# Patient Record
Sex: Female | Born: 1955
Health system: Southern US, Community
[De-identification: ages and names within clinical notes are randomized; demographics above are authoritative.]

## PROBLEM LIST (undated history)

## (undated) DIAGNOSIS — C50919 Malignant neoplasm of unspecified site of unspecified female breast: Secondary | ICD-10-CM

## (undated) HISTORY — PX: ABDOMINAL HYSTERECTOMY: SHX81

## (undated) HISTORY — PX: MASTECTOMY: SHX3

---

## 2019-04-23 DIAGNOSIS — L578 Other skin changes due to chronic exposure to nonionizing radiation: Secondary | ICD-10-CM | POA: Diagnosis not present

## 2019-04-23 DIAGNOSIS — D225 Melanocytic nevi of trunk: Secondary | ICD-10-CM | POA: Diagnosis not present

## 2019-04-23 DIAGNOSIS — L570 Actinic keratosis: Secondary | ICD-10-CM | POA: Diagnosis not present

## 2019-04-23 DIAGNOSIS — L814 Other melanin hyperpigmentation: Secondary | ICD-10-CM | POA: Diagnosis not present

## 2019-06-25 DIAGNOSIS — R229 Localized swelling, mass and lump, unspecified: Secondary | ICD-10-CM | POA: Diagnosis not present

## 2019-06-25 DIAGNOSIS — Z1329 Encounter for screening for other suspected endocrine disorder: Secondary | ICD-10-CM | POA: Diagnosis not present

## 2019-06-25 DIAGNOSIS — K432 Incisional hernia without obstruction or gangrene: Secondary | ICD-10-CM | POA: Diagnosis not present

## 2019-06-25 DIAGNOSIS — Z0000 Encounter for general adult medical examination without abnormal findings: Secondary | ICD-10-CM | POA: Diagnosis not present

## 2019-06-25 DIAGNOSIS — Z853 Personal history of malignant neoplasm of breast: Secondary | ICD-10-CM | POA: Diagnosis not present

## 2019-06-25 DIAGNOSIS — Z13220 Encounter for screening for lipoid disorders: Secondary | ICD-10-CM | POA: Diagnosis not present

## 2019-07-28 DIAGNOSIS — K432 Incisional hernia without obstruction or gangrene: Secondary | ICD-10-CM | POA: Diagnosis not present

## 2019-07-28 DIAGNOSIS — Z1211 Encounter for screening for malignant neoplasm of colon: Secondary | ICD-10-CM | POA: Diagnosis not present

## 2019-07-28 DIAGNOSIS — R221 Localized swelling, mass and lump, neck: Secondary | ICD-10-CM | POA: Diagnosis not present

## 2019-08-04 DIAGNOSIS — E041 Nontoxic single thyroid nodule: Secondary | ICD-10-CM | POA: Diagnosis not present

## 2019-08-04 DIAGNOSIS — R221 Localized swelling, mass and lump, neck: Secondary | ICD-10-CM | POA: Diagnosis not present

## 2019-08-04 DIAGNOSIS — R229 Localized swelling, mass and lump, unspecified: Secondary | ICD-10-CM | POA: Diagnosis not present

## 2019-08-06 DIAGNOSIS — Z1211 Encounter for screening for malignant neoplasm of colon: Secondary | ICD-10-CM | POA: Diagnosis not present

## 2019-10-12 DIAGNOSIS — J342 Deviated nasal septum: Secondary | ICD-10-CM | POA: Diagnosis not present

## 2019-10-12 DIAGNOSIS — Z853 Personal history of malignant neoplasm of breast: Secondary | ICD-10-CM | POA: Diagnosis not present

## 2019-10-12 DIAGNOSIS — R221 Localized swelling, mass and lump, neck: Secondary | ICD-10-CM | POA: Diagnosis not present

## 2019-10-12 DIAGNOSIS — K118 Other diseases of salivary glands: Secondary | ICD-10-CM | POA: Diagnosis not present

## 2019-10-19 DIAGNOSIS — L821 Other seborrheic keratosis: Secondary | ICD-10-CM | POA: Diagnosis not present

## 2019-12-28 DIAGNOSIS — Z9011 Acquired absence of right breast and nipple: Secondary | ICD-10-CM | POA: Diagnosis not present

## 2019-12-28 DIAGNOSIS — C50912 Malignant neoplasm of unspecified site of left female breast: Secondary | ICD-10-CM | POA: Diagnosis not present

## 2020-01-11 DIAGNOSIS — Z9011 Acquired absence of right breast and nipple: Secondary | ICD-10-CM | POA: Diagnosis not present

## 2020-01-11 DIAGNOSIS — C50912 Malignant neoplasm of unspecified site of left female breast: Secondary | ICD-10-CM | POA: Diagnosis not present

## 2020-02-03 DIAGNOSIS — M9901 Segmental and somatic dysfunction of cervical region: Secondary | ICD-10-CM | POA: Diagnosis not present

## 2020-02-03 DIAGNOSIS — M9903 Segmental and somatic dysfunction of lumbar region: Secondary | ICD-10-CM | POA: Diagnosis not present

## 2020-02-03 DIAGNOSIS — M5032 Other cervical disc degeneration, mid-cervical region, unspecified level: Secondary | ICD-10-CM | POA: Diagnosis not present

## 2020-02-03 DIAGNOSIS — M47812 Spondylosis without myelopathy or radiculopathy, cervical region: Secondary | ICD-10-CM | POA: Diagnosis not present

## 2020-02-04 DIAGNOSIS — M5032 Other cervical disc degeneration, mid-cervical region, unspecified level: Secondary | ICD-10-CM | POA: Diagnosis not present

## 2020-02-04 DIAGNOSIS — M9903 Segmental and somatic dysfunction of lumbar region: Secondary | ICD-10-CM | POA: Diagnosis not present

## 2020-02-04 DIAGNOSIS — M9901 Segmental and somatic dysfunction of cervical region: Secondary | ICD-10-CM | POA: Diagnosis not present

## 2020-02-04 DIAGNOSIS — M47812 Spondylosis without myelopathy or radiculopathy, cervical region: Secondary | ICD-10-CM | POA: Diagnosis not present

## 2020-02-08 DIAGNOSIS — M5032 Other cervical disc degeneration, mid-cervical region, unspecified level: Secondary | ICD-10-CM | POA: Diagnosis not present

## 2020-02-08 DIAGNOSIS — M9903 Segmental and somatic dysfunction of lumbar region: Secondary | ICD-10-CM | POA: Diagnosis not present

## 2020-02-08 DIAGNOSIS — M9901 Segmental and somatic dysfunction of cervical region: Secondary | ICD-10-CM | POA: Diagnosis not present

## 2020-02-08 DIAGNOSIS — M47812 Spondylosis without myelopathy or radiculopathy, cervical region: Secondary | ICD-10-CM | POA: Diagnosis not present

## 2020-02-10 DIAGNOSIS — M9903 Segmental and somatic dysfunction of lumbar region: Secondary | ICD-10-CM | POA: Diagnosis not present

## 2020-02-10 DIAGNOSIS — M9901 Segmental and somatic dysfunction of cervical region: Secondary | ICD-10-CM | POA: Diagnosis not present

## 2020-02-10 DIAGNOSIS — M47812 Spondylosis without myelopathy or radiculopathy, cervical region: Secondary | ICD-10-CM | POA: Diagnosis not present

## 2020-02-10 DIAGNOSIS — M5032 Other cervical disc degeneration, mid-cervical region, unspecified level: Secondary | ICD-10-CM | POA: Diagnosis not present

## 2020-02-11 DIAGNOSIS — M47812 Spondylosis without myelopathy or radiculopathy, cervical region: Secondary | ICD-10-CM | POA: Diagnosis not present

## 2020-02-11 DIAGNOSIS — M5032 Other cervical disc degeneration, mid-cervical region, unspecified level: Secondary | ICD-10-CM | POA: Diagnosis not present

## 2020-02-11 DIAGNOSIS — M9903 Segmental and somatic dysfunction of lumbar region: Secondary | ICD-10-CM | POA: Diagnosis not present

## 2020-02-11 DIAGNOSIS — M9901 Segmental and somatic dysfunction of cervical region: Secondary | ICD-10-CM | POA: Diagnosis not present

## 2020-05-11 DIAGNOSIS — D225 Melanocytic nevi of trunk: Secondary | ICD-10-CM | POA: Diagnosis not present

## 2020-05-11 DIAGNOSIS — L578 Other skin changes due to chronic exposure to nonionizing radiation: Secondary | ICD-10-CM | POA: Diagnosis not present

## 2020-05-11 DIAGNOSIS — L814 Other melanin hyperpigmentation: Secondary | ICD-10-CM | POA: Diagnosis not present

## 2020-05-11 DIAGNOSIS — L570 Actinic keratosis: Secondary | ICD-10-CM | POA: Diagnosis not present

## 2020-05-31 DIAGNOSIS — R310 Gross hematuria: Secondary | ICD-10-CM | POA: Diagnosis not present

## 2020-06-02 DIAGNOSIS — Z0189 Encounter for other specified special examinations: Secondary | ICD-10-CM | POA: Diagnosis not present

## 2020-06-15 DIAGNOSIS — M545 Low back pain: Secondary | ICD-10-CM | POA: Diagnosis not present

## 2020-06-22 DIAGNOSIS — N1330 Unspecified hydronephrosis: Secondary | ICD-10-CM | POA: Diagnosis not present

## 2020-06-22 DIAGNOSIS — K824 Cholesterolosis of gallbladder: Secondary | ICD-10-CM | POA: Diagnosis not present

## 2020-06-22 DIAGNOSIS — R310 Gross hematuria: Secondary | ICD-10-CM | POA: Diagnosis not present

## 2020-07-28 ENCOUNTER — Other Ambulatory Visit: Payer: Self-pay

## 2020-07-28 ENCOUNTER — Emergency Department (HOSPITAL_BASED_OUTPATIENT_CLINIC_OR_DEPARTMENT_OTHER): Payer: Federal, State, Local not specified - PPO

## 2020-07-28 ENCOUNTER — Emergency Department (HOSPITAL_BASED_OUTPATIENT_CLINIC_OR_DEPARTMENT_OTHER)
Admission: EM | Admit: 2020-07-28 | Discharge: 2020-07-28 | Disposition: A | Discharge status: Home / Self Care | Payer: Federal, State, Local not specified - PPO | Attending: Emergency Medicine | Admitting: Emergency Medicine

## 2020-07-28 ENCOUNTER — Encounter (HOSPITAL_BASED_OUTPATIENT_CLINIC_OR_DEPARTMENT_OTHER): Payer: Self-pay

## 2020-07-28 DIAGNOSIS — N132 Hydronephrosis with renal and ureteral calculous obstruction: Secondary | ICD-10-CM | POA: Diagnosis not present

## 2020-07-28 DIAGNOSIS — R1110 Vomiting, unspecified: Secondary | ICD-10-CM | POA: Diagnosis not present

## 2020-07-28 DIAGNOSIS — R112 Nausea with vomiting, unspecified: Secondary | ICD-10-CM | POA: Diagnosis not present

## 2020-07-28 DIAGNOSIS — N200 Calculus of kidney: Secondary | ICD-10-CM | POA: Diagnosis not present

## 2020-07-28 DIAGNOSIS — Z853 Personal history of malignant neoplasm of breast: Secondary | ICD-10-CM | POA: Insufficient documentation

## 2020-07-28 DIAGNOSIS — R1013 Epigastric pain: Secondary | ICD-10-CM | POA: Diagnosis not present

## 2020-07-28 DIAGNOSIS — R109 Unspecified abdominal pain: Secondary | ICD-10-CM | POA: Diagnosis not present

## 2020-07-28 DIAGNOSIS — R110 Nausea: Secondary | ICD-10-CM | POA: Diagnosis not present

## 2020-07-28 HISTORY — DX: Malignant neoplasm of unspecified site of unspecified female breast: C50.919

## 2020-07-28 LAB — CBC WITH DIFFERENTIAL/PLATELET
Abs Immature Granulocytes: 0.01 10*3/uL (ref 0.00–0.07)
Basophils Absolute: 0 10*3/uL (ref 0.0–0.1)
Basophils Relative: 0 %
Eosinophils Absolute: 0 10*3/uL (ref 0.0–0.5)
Eosinophils Relative: 0 %
HCT: 40.8 % (ref 36.0–46.0)
Hemoglobin: 13.7 g/dL (ref 12.0–15.0)
Immature Granulocytes: 0 %
Lymphocytes Relative: 14 %
Lymphs Abs: 0.9 10*3/uL (ref 0.7–4.0)
MCH: 32.9 pg (ref 26.0–34.0)
MCHC: 33.6 g/dL (ref 30.0–36.0)
MCV: 97.8 fL (ref 80.0–100.0)
Monocytes Absolute: 0.6 10*3/uL (ref 0.1–1.0)
Monocytes Relative: 9 %
Neutro Abs: 4.9 10*3/uL (ref 1.7–7.7)
Neutrophils Relative %: 77 %
Platelets: 189 10*3/uL (ref 150–400)
RBC: 4.17 MIL/uL (ref 3.87–5.11)
RDW: 12.1 % (ref 11.5–15.5)
WBC: 6.3 10*3/uL (ref 4.0–10.5)
nRBC: 0 % (ref 0.0–0.2)

## 2020-07-28 LAB — COMPREHENSIVE METABOLIC PANEL
ALT: 15 U/L (ref 0–44)
AST: 25 U/L (ref 15–41)
Albumin: 4.2 g/dL (ref 3.5–5.0)
Alkaline Phosphatase: 55 U/L (ref 38–126)
Anion gap: 8 (ref 5–15)
BUN: 19 mg/dL (ref 8–23)
CO2: 26 mmol/L (ref 22–32)
Calcium: 9.2 mg/dL (ref 8.9–10.3)
Chloride: 104 mmol/L (ref 98–111)
Creatinine, Ser: 1.06 mg/dL — ABNORMAL HIGH (ref 0.44–1.00)
GFR, Estimated: 59 mL/min — ABNORMAL LOW (ref >60)
Glucose, Bld: 122 mg/dL — ABNORMAL HIGH (ref 70–99)
Potassium: 3.8 mmol/L (ref 3.5–5.1)
Sodium: 138 mmol/L (ref 135–145)
Total Bilirubin: 0.6 mg/dL (ref 0.3–1.2)
Total Protein: 7.2 g/dL (ref 6.5–8.1)

## 2020-07-28 LAB — I-STAT CHEM 8, ED
BUN: 18 mg/dL (ref 8–23)
Calcium, Ion: 1.24 mmol/L (ref 1.15–1.40)
Chloride: 102 mmol/L (ref 98–111)
Creatinine, Ser: 0.9 mg/dL (ref 0.44–1.00)
Glucose, Bld: 119 mg/dL — ABNORMAL HIGH (ref 70–99)
HCT: 40 % (ref 36.0–46.0)
Hemoglobin: 13.6 g/dL (ref 12.0–15.0)
Potassium: 3.8 mmol/L (ref 3.5–5.1)
Sodium: 141 mmol/L (ref 135–145)
TCO2: 24 mmol/L (ref 22–32)

## 2020-07-28 LAB — LIPASE, BLOOD: Lipase: 26 U/L (ref 11–51)

## 2020-07-28 MED ORDER — TAMSULOSIN HCL 0.4 MG PO CAPS: 0.4000 mg | ORAL_CAPSULE | Freq: Every day | ORAL | 0 refills | Status: AC

## 2020-07-28 MED ORDER — ONDANSETRON 4 MG PO TBDP: 4.0000 mg | ORAL_TABLET | Freq: Three times a day (TID) | ORAL | 0 refills | Status: AC | PRN

## 2020-07-28 MED ORDER — OXYCODONE-ACETAMINOPHEN 5-325 MG PO TABS
1.0000 | ORAL_TABLET | Freq: Four times a day (QID) | ORAL | 0 refills | Status: AC | PRN
Start: 2020-07-28 — End: ?

## 2020-07-28 MED ORDER — LACTATED RINGERS IV BOLUS
1000.0000 mL | Freq: Once | INTRAVENOUS | Status: AC
  Administered 2020-07-28: 1000 mL via INTRAVENOUS

## 2020-07-28 MED ORDER — IOHEXOL 300 MG/ML  SOLN
100.0000 mL | Freq: Once | INTRAMUSCULAR | Status: AC | PRN
  Administered 2020-07-28: 100 mL via INTRAVENOUS

## 2020-07-28 NOTE — Discharge Instructions (Signed)
If you develop fever, worsening pain, persistent vomiting return to the ER

## 2020-07-28 NOTE — ED Provider Notes (Signed)
[facility] Provider Note   CSN: [id number] Arrival date & time: 07/28/20  1637     History Chief Complaint  Patient presents with  . Abdominal Pain    Sheryl Rodriguez is a 64 y.o. female.  Patient is a 64 year old female with a history of breast cancer who is presenting today with complaint of abdominal pain.  Patient was at acute care prior to this and was sent here for further evaluation.  Patient reports she felt completely normal yesterday and normal when she went to bed.  Around 4 AM she woke up with a severe 10 out of 10 pain which she describes was in her lower abdomen.  She since that time has had 4-5 episodes of vomiting and 4-5 episodes of soft stool but no diarrhea.  She reports the pain is in the suprapubic area and radiates around into her back on both sides.  Nothing seems to make the pain worse.  No movement or eating.  She reports she really has not been able to hold anything down and is only been able to take 4 sips of Gatorade today.  She has not had fever, cough, congestion, shortness of breath.  She has had a complete hysterectomy but no other abdominal surgeries.  Patient reports that she did have some blood in her urine a month ago and saw her PCP.  At that time she had an ultrasound that showed some polyps in the gallbladder and possibly a urinary tract infection but reports then the culture came back negative and she has not had any problems since.  Patient does have a prior history of kidney stone 15 years ago that did require intervention but reports she has never experienced pain like this.  She reports at this time the pain is still 8 out of 10.  It is sharp and very uncomfortable.  She has not had any dysuria, frequency or urgency.  No bad food contacts.  She did have a near syncopal episode today when she stood up too quickly when she went to the bathroom to vomit but denies any injury.  At urgent care patient did have a urine that showed  moderate hemoglobin, ketones and protein but no other signs of infection.  The history is provided by the patient.  Abdominal Pain      Past Medical History:  Diagnosis Date  . Breast cancer ([place])     There are no problems to display for this patient.   Past Surgical History:  Procedure Laterality Date  . ABDOMINAL HYSTERECTOMY    . MASTECTOMY       OB History   No obstetric history on file.     No family history on file.  Social History   Tobacco Use  . Smoking status: Never Smoker  . Smokeless tobacco: Never Used  Substance Use Topics  . Alcohol use: Never  . Drug use: Never    Home Medications Prior to Admission medications   Not on File    Allergies    Codeine  Review of Systems   Review of Systems  Gastrointestinal: Positive for abdominal pain.  All other systems reviewed and are negative.   Physical Exam Updated Vital Signs BP 137/80 (BP Location: Left Arm)   Pulse 65   Temp 98.6 F (37 C) (Oral)   Resp 18   Ht 5\' 5"  (1.651 m)   Wt 61.2 kg   SpO2 98%   BMI 22.47 kg/m   Physical Exam  Vitals and nursing note reviewed.  Constitutional:      General: She is not in acute distress.    Appearance: She is well-developed and normal weight.  HENT:     Head: Normocephalic and atraumatic.     Mouth/Throat:     Mouth: Mucous membranes are dry.  Eyes:     Pupils: Pupils are equal, round, and reactive to light.  Cardiovascular:     Rate and Rhythm: Normal rate and regular rhythm.     Pulses: Normal pulses.     Heart sounds: Normal heart sounds. No murmur heard.  No friction rub.  Pulmonary:     Effort: Pulmonary effort is normal.     Breath sounds: Normal breath sounds. No wheezing or rales.  Abdominal:     General: Abdomen is flat. Bowel sounds are normal. There is no distension.     Palpations: Abdomen is soft.     Tenderness: There is abdominal tenderness in the epigastric area, suprapubic area and left lower quadrant. There is right  CVA tenderness. There is no guarding or rebound.     Hernia: No hernia is present.  Musculoskeletal:        General: No tenderness. Normal range of motion.     Right lower leg: No edema.     Left lower leg: No edema.     Comments: No edema  Skin:    General: Skin is warm and dry.     Findings: No rash.  Neurological:     General: No focal deficit present.     Mental Status: She is alert and oriented to person, place, and time. Mental status is at baseline.     Cranial Nerves: No cranial nerve deficit.  Psychiatric:        Mood and Affect: Mood normal.        Behavior: Behavior normal.        Thought Content: Thought content normal.     ED Results / Procedures / Treatments   Labs (all labs ordered are listed, but only abnormal results are displayed) Labs Reviewed  COMPREHENSIVE METABOLIC PANEL - Abnormal; Notable for the following components:      Result Value   Glucose, Bld 122 (*)    Creatinine, Ser 1.06 (*)    GFR, Estimated 59 (*)    All other components within normal limits  I-STAT CHEM 8, ED - Abnormal; Notable for the following components:   Glucose, Bld 119 (*)    All other components within normal limits  CBC WITH DIFFERENTIAL/PLATELET  LIPASE, BLOOD    EKG None  Radiology CT ABDOMEN PELVIS W CONTRAST  Result Date: 07/28/2020 CLINICAL DATA:  Abdominal pain with nausea and vomiting EXAM: CT ABDOMEN AND PELVIS WITH CONTRAST TECHNIQUE: Multidetector CT imaging of the abdomen and pelvis was performed using the standard protocol following bolus administration of intravenous contrast. CONTRAST:  159mL OMNIPAQUE IOHEXOL 300 MG/ML  SOLN COMPARISON:  Ultrasound 06/22/2020 FINDINGS: Lower chest: Lung bases demonstrate no acute consolidation or pleural effusion. Cardiac size within normal limits. Small hiatal hernia. Hepatobiliary: No focal liver abnormality is seen. No gallstones, gallbladder wall thickening, or biliary dilatation. Pancreas: Unremarkable. No pancreatic  ductal dilatation or surrounding inflammatory changes. Spleen: Normal in size without focal abnormality. Adrenals/Urinary Tract: Adrenal glands are normal. Moderate severe right hydronephrosis and proximal hydroureter, secondary to 2 adjacent stones within the right ureter just distal to the right UPJ. The more cranial stone measures 7 mm in size and the more caudal stone  measures 5 mm in size. There is mild urothelial enhancement of the right renal pelvis and proximal ureter. The urinary bladder is unremarkable. Delayed right nephrogram consistent with obstruction. Stomach/Bowel: Stomach is within normal limits. Negative appendix. No evidence of bowel wall thickening, distention, or inflammatory changes. Vascular/Lymphatic: Nonaneurysmal aorta.  No suspicious adenopathy Reproductive: Status post hysterectomy. No adnexal masses. Other: No free air or free fluid. Hazy infiltration of the left abdominal mesentery with small nodes. Musculoskeletal: No acute or significant osseous findings. Degenerative changes at L3-L4. IMPRESSION: 1. Moderate severe right hydronephrosis and proximal hydroureter, secondary to 2 adjacent stones within the right ureter just distal to the right UPJ. The more cranial stone measures 7 mm in size and the more caudal stone measures 5 mm in size. There is mild urothelial enhancement of the right renal pelvis and proximal ureter which may be secondary to infection or inflammation. 2. Hazy infiltration of the left abdominal mesentery with small nodes, suggestive of mesenteric panniculitis. 3. Small hiatal hernia. Electronically Signed   By: Donavan Foil M.D.   On: 07/28/2020 18:09    Procedures Procedures (including critical care time)  Medications Ordered in ED Medications  lactated ringers bolus 1,000 mL (has no administration in time range)    ED Course  I have reviewed the triage vital signs and the nursing notes.  Pertinent labs & imaging results that were available during my  care of the patient were reviewed by me and considered in my medical decision making (see chart for details).    MDM Rules/Calculators/A&P                          Pleasant 64 year old female presenting today with abdominal pain that was sudden onset at 4 AM this morning.  She has had ongoing vomiting and soft stools.  On exam she has epigastric discomfort as well as suprapubic and left lower quadrant pain.  Patient did have blood in her urine at urgent care but no evidence of infection.  She is not having fever or other infectious symptoms.  Low suspicion for cardiac or respiratory issue.  Given her upper and lower abdominal pain concern for possible pancreatitis, cholecystitis, nephrolithiasis.  Lower suspicion for bowel obstruction as patient is having stools.  Labs and CT pending.  Patient received antiemetics at urgent care and at this time reports she does not want pain medication.  She was given a bolus of fluid.  6:37 PM On reevaluation patient reports the nausea is gone and her pain is still an 8 out of 10 but she does not wish to have pain medication.  CBC and CMP without acute findings.  CT of the abdomen and pelvis shows moderate severe right hydronephrosis and proximal hydroureter secondary to 2 adjacent stones within the right ureter just distal to the right UPJ.  The stones are 7 mm 5 mm in size.  Patient's urine from urgent care showed no signs of infection white count normal and patient has been afebrile.  Also on CT it shows a hazy infiltration of the left abdominal mesentery with small nodes suggestive of mesenteric panniculitis.  Did discuss these findings with the patient and she needs to follow-up with her PCP.  Also will have patient follow-up with urology if stones do not pass.  She was started on Flomax but does not wish to have pain medication at this time.  MDM Number of Diagnoses or Management Options   Amount and/or Complexity of  Data Reviewed Clinical lab tests: reviewed  and ordered Tests in the radiology section of CPT: ordered and reviewed Decide to obtain previous medical records or to obtain history from someone other than the patient: yes Obtain history from someone other than the patient: no Review and summarize past medical records: yes Independent visualization of images, tracings, or specimens: yes  Risk of Complications, Morbidity, and/or Mortality Presenting problems: moderate Diagnostic procedures: low Management options: low  Patient Progress Patient progress: stable     Final Clinical Impression(s) / ED Diagnoses Final diagnoses:  Nephrolithiasis    Rx / DC Orders ED Discharge Orders         Ordered    tamsulosin (FLOMAX) 0.4 MG CAPS capsule  Daily after supper        07/28/20 1841    ondansetron (ZOFRAN ODT) 4 MG disintegrating tablet  Every 8 hours PRN        07/28/20 1841           Blanchie Dessert, MD 07/28/20 1841

## 2020-07-28 NOTE — ED Triage Notes (Signed)
Pt c/o lower abd pain, n/v started yesterday-sent from [facility]-steady gait

## 2020-08-01 DIAGNOSIS — N201 Calculus of ureter: Secondary | ICD-10-CM | POA: Diagnosis not present

## 2020-08-03 DIAGNOSIS — N201 Calculus of ureter: Secondary | ICD-10-CM | POA: Diagnosis not present

## 2021-08-24 IMAGING — CT CT ABD-PELV W/ CM
2 of 5 series · 15 of 46 positions shown, 17 images · IV contrast (Omnipaque)
Comparison: Ultrasound 06/22/2020

CLINICAL DATA: Abdominal pain with nausea and vomiting

EXAM:
CT ABDOMEN AND PELVIS WITH CONTRAST
TECHNIQUE: Multidetector CT imaging of the abdomen and pelvis was performed
using the standard protocol following bolus administration of
intravenous contrast.
CONTRAST:  100mL OMNIPAQUE IOHEXOL 300 MG/ML  SOLN

[Series 2: axial st · axial · 0.74mm/px · z∈[-603,-233]mm · 12 of 84 slices shown, 14 images]
[im 5/84  soft-tissue]
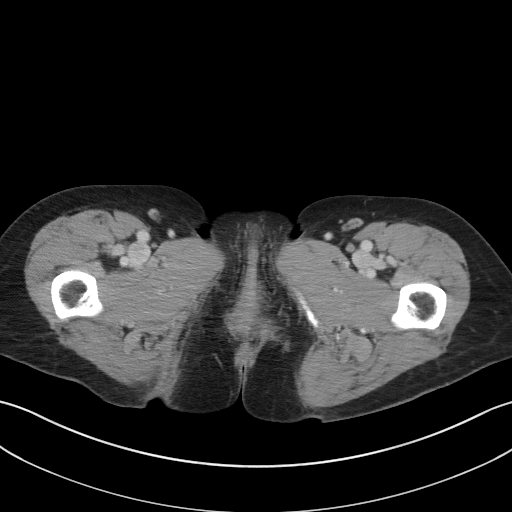
[im 5/84  bone]
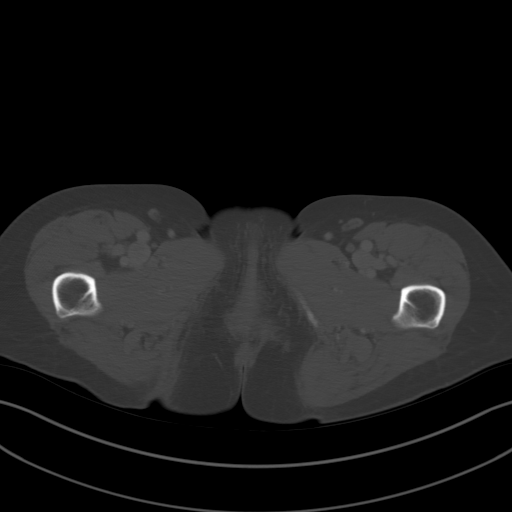
[im 14/84  soft-tissue]
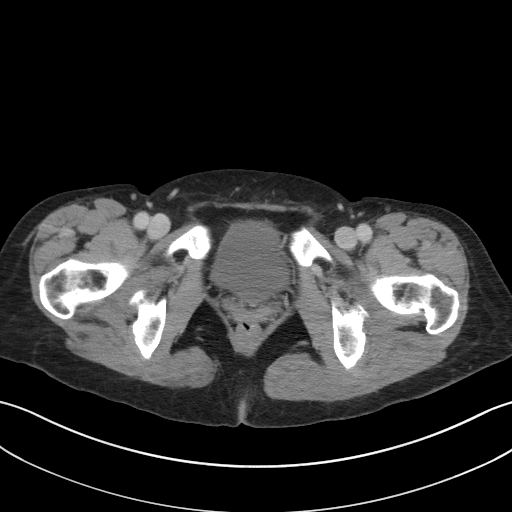
[im 18/84  soft-tissue]
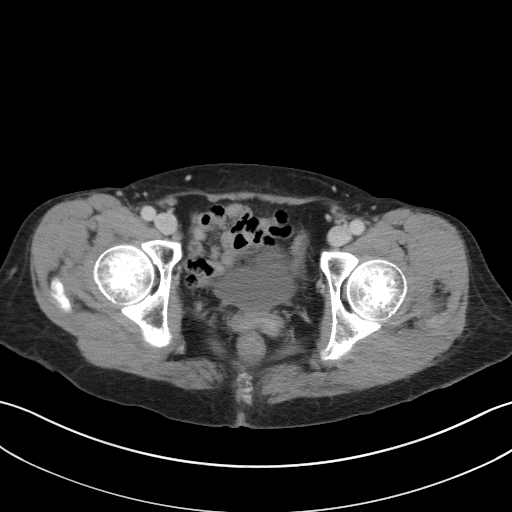
[im 27/84  soft-tissue]
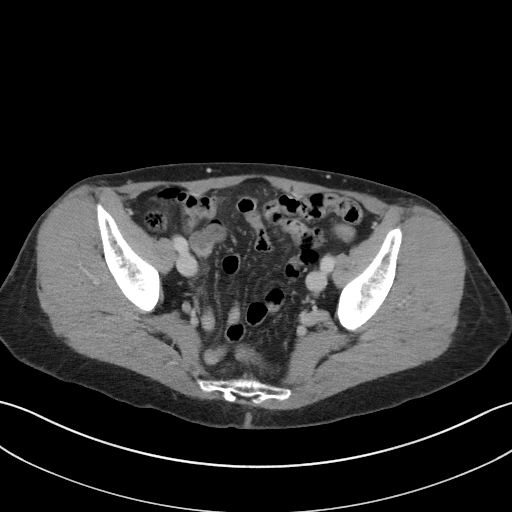
[im 31/84  soft-tissue]
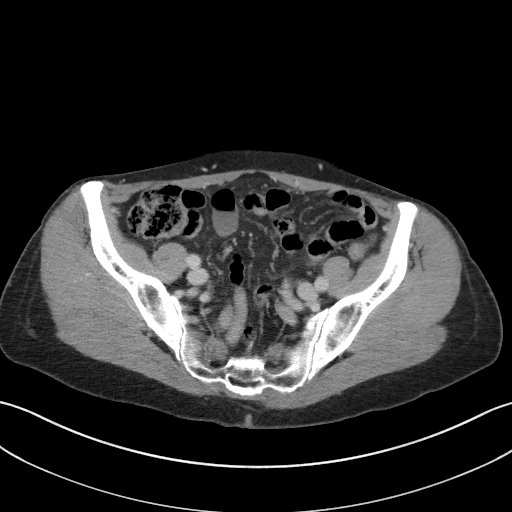
[im 40/84  soft-tissue]
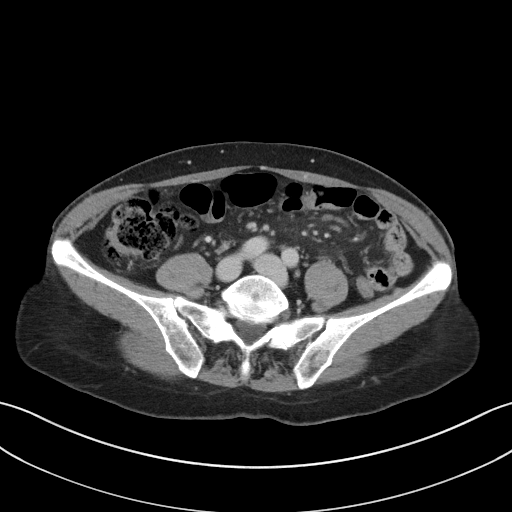
[im 44/84  soft-tissue]
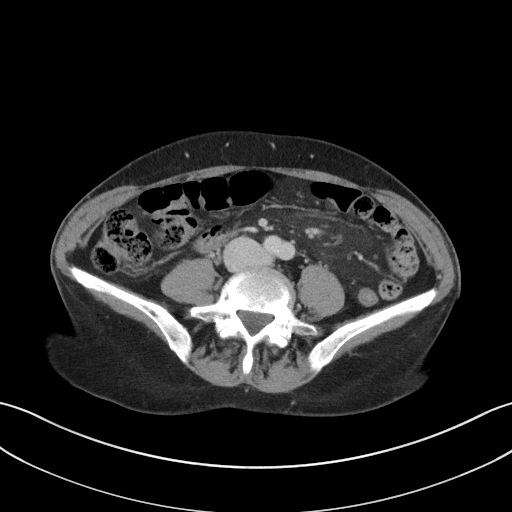
[im 53/84  soft-tissue]
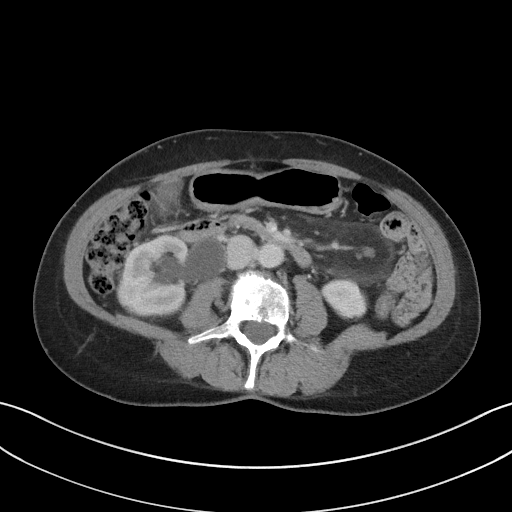
[im 57/84  soft-tissue]
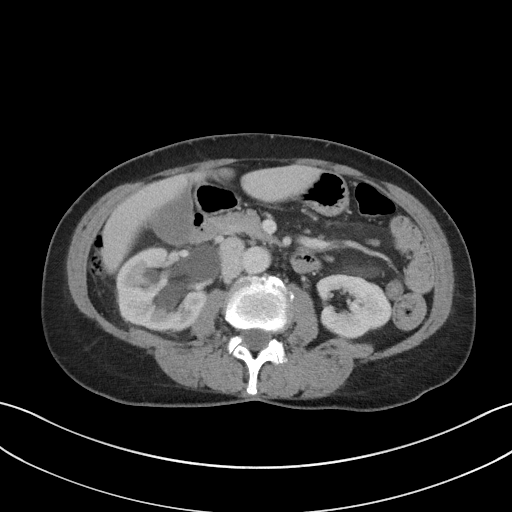
[im 57/84  bone]
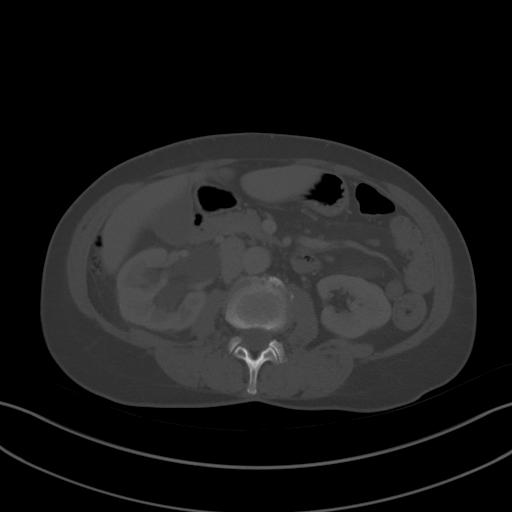
[im 66/84  soft-tissue]
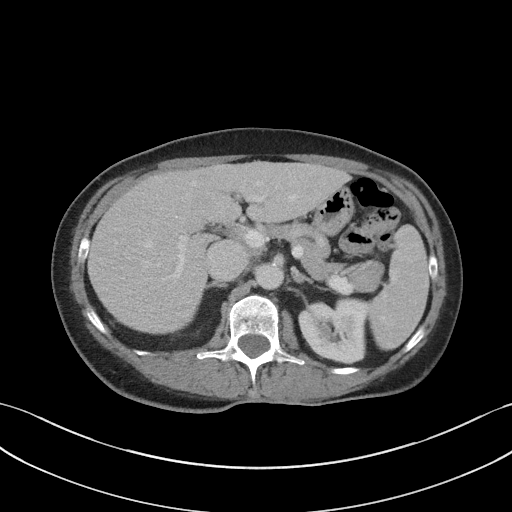
[im 70/84  soft-tissue]
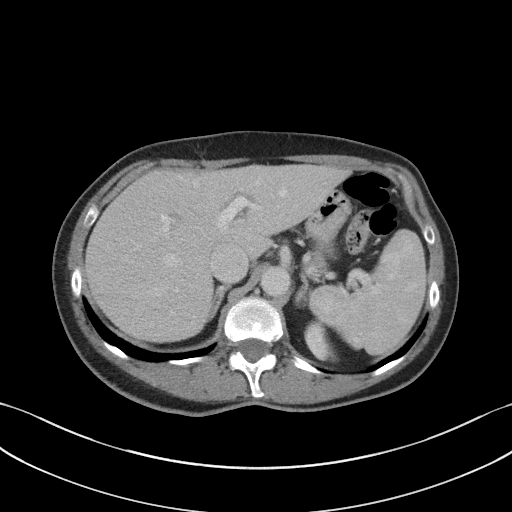
[im 79/84  soft-tissue]
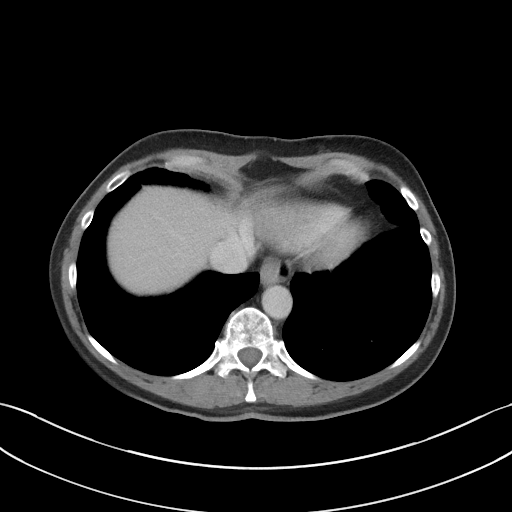

[Series 5: coronal st · coronal · 0.67mm/px · 3 of 80 slices shown]
[im 27/80  soft-tissue]
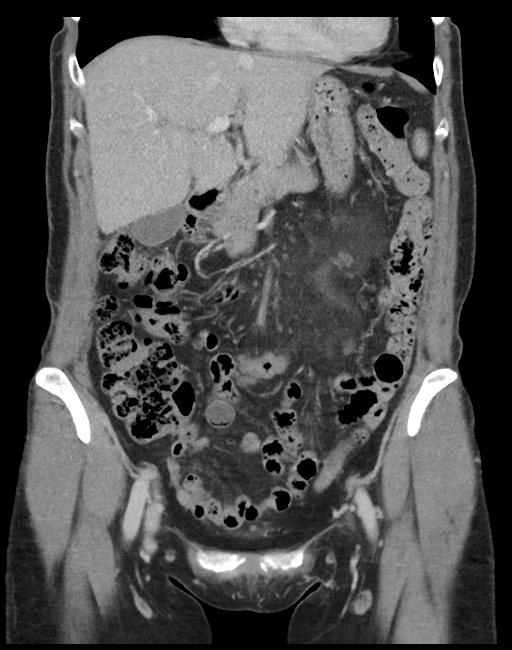
[im 36/80  soft-tissue]
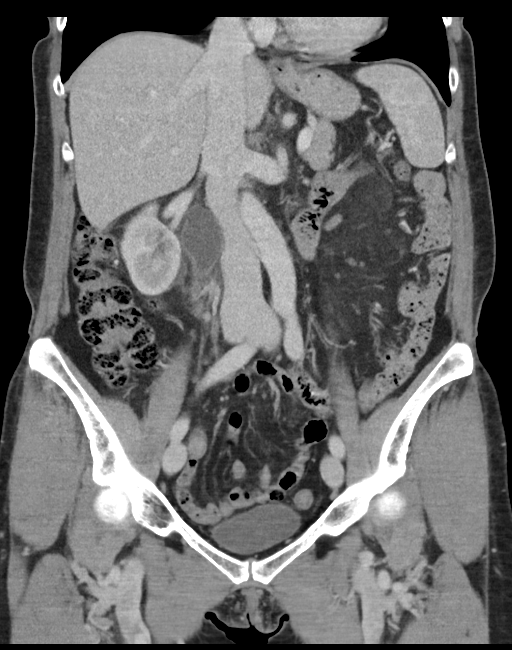
[im 44/80  soft-tissue]
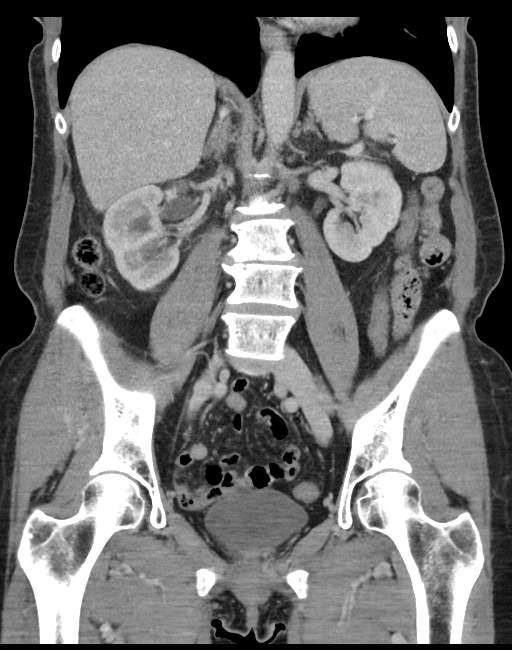

[15 of 46 positions shown; findings below may reference images not displayed]

FINDINGS: Lower chest: Lung bases demonstrate no acute consolidation or
pleural effusion. Cardiac size within normal limits. Small hiatal
hernia.

Hepatobiliary: No focal liver abnormality is seen. No gallstones,
gallbladder wall thickening, or biliary dilatation.

Pancreas: Unremarkable. No pancreatic ductal dilatation or
surrounding inflammatory changes.

Spleen: Normal in size without focal abnormality.

Adrenals/Urinary Tract: Adrenal glands are normal. Moderate severe
right hydronephrosis and proximal hydroureter, secondary to 2
adjacent stones within the right ureter just distal to the right
UPJ. The more cranial stone measures 7 mm in size and the more
caudal stone measures 5 mm in size. There is mild urothelial
enhancement of the right renal pelvis and proximal ureter. The
urinary bladder is unremarkable. Delayed right nephrogram consistent
with obstruction.

Stomach/Bowel: Stomach is within normal limits. Negative appendix.
No evidence of bowel wall thickening, distention, or inflammatory
changes.

Vascular/Lymphatic: Nonaneurysmal aorta.  No suspicious adenopathy

Reproductive: Status post hysterectomy. No adnexal masses.

Other: No free air or free fluid. Hazy infiltration of the left
abdominal mesentery with small nodes.

Musculoskeletal: No acute or significant osseous findings.
Degenerative changes at L3-L4.
IMPRESSION: 1. Moderate severe right hydronephrosis and proximal hydroureter,
secondary to 2 adjacent stones within the right ureter just distal
to the right UPJ. The more cranial stone measures 7 mm in size and
the more caudal stone measures 5 mm in size. There is mild
urothelial enhancement of the right renal pelvis and proximal ureter
which may be secondary to infection or inflammation.
2. Hazy infiltration of the left abdominal mesentery with small
nodes, suggestive of mesenteric panniculitis.
3. Small hiatal hernia.

## 2022-08-14 ENCOUNTER — Other Ambulatory Visit (HOSPITAL_BASED_OUTPATIENT_CLINIC_OR_DEPARTMENT_OTHER): Payer: Self-pay | Admitting: Emergency Medicine

## 2022-08-14 DIAGNOSIS — R2232 Localized swelling, mass and lump, left upper limb: Secondary | ICD-10-CM
